# Patient Record
Sex: Male | Born: 1945 | Race: White | Hispanic: No | Marital: Married | Smoking: Current every day smoker
Health system: Southern US, Community
[De-identification: ages and names within clinical notes are randomized; demographics above are authoritative.]

## PROBLEM LIST (undated history)

## (undated) DIAGNOSIS — I2510 Atherosclerotic heart disease of native coronary artery without angina pectoris: Secondary | ICD-10-CM

## (undated) DIAGNOSIS — I059 Rheumatic mitral valve disease, unspecified: Secondary | ICD-10-CM

## (undated) DIAGNOSIS — Z720 Tobacco use: Secondary | ICD-10-CM

## (undated) DIAGNOSIS — I340 Nonrheumatic mitral (valve) insufficiency: Secondary | ICD-10-CM

## (undated) DIAGNOSIS — E785 Hyperlipidemia, unspecified: Secondary | ICD-10-CM

## (undated) DIAGNOSIS — I119 Hypertensive heart disease without heart failure: Secondary | ICD-10-CM

## (undated) HISTORY — DX: Hypertensive heart disease without heart failure: I11.9

## (undated) HISTORY — DX: Atherosclerotic heart disease of native coronary artery without angina pectoris: I25.10

## (undated) HISTORY — DX: Rheumatic mitral valve disease, unspecified: I05.9

## (undated) HISTORY — DX: Nonrheumatic mitral (valve) insufficiency: I34.0

## (undated) HISTORY — PX: HERNIA REPAIR: SHX51

## (undated) HISTORY — DX: Hyperlipidemia, unspecified: E78.5

## (undated) HISTORY — DX: Tobacco use: Z72.0

---
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
## 2006-08-04 HISTORY — PX: OTHER SURGICAL HISTORY: SHX169

## 2007-08-13 ENCOUNTER — Encounter: Admission: RE | Admit: 2007-08-13 | Discharge: 2007-08-13 | Payer: Self-pay | Admitting: Orthopedic Surgery

## 2008-07-11 IMAGING — CR DG CERVICAL SPINE 2 OR 3 VIEWS
3 series · 3 of 3 positions shown · non-contrast
Comparison: none

CLINICAL DATA: Left sided neck and shoulder tightness and discomfort. 
 CERVICAL SPINE - 3 VIEW:

[view not recorded (1 of 3)]
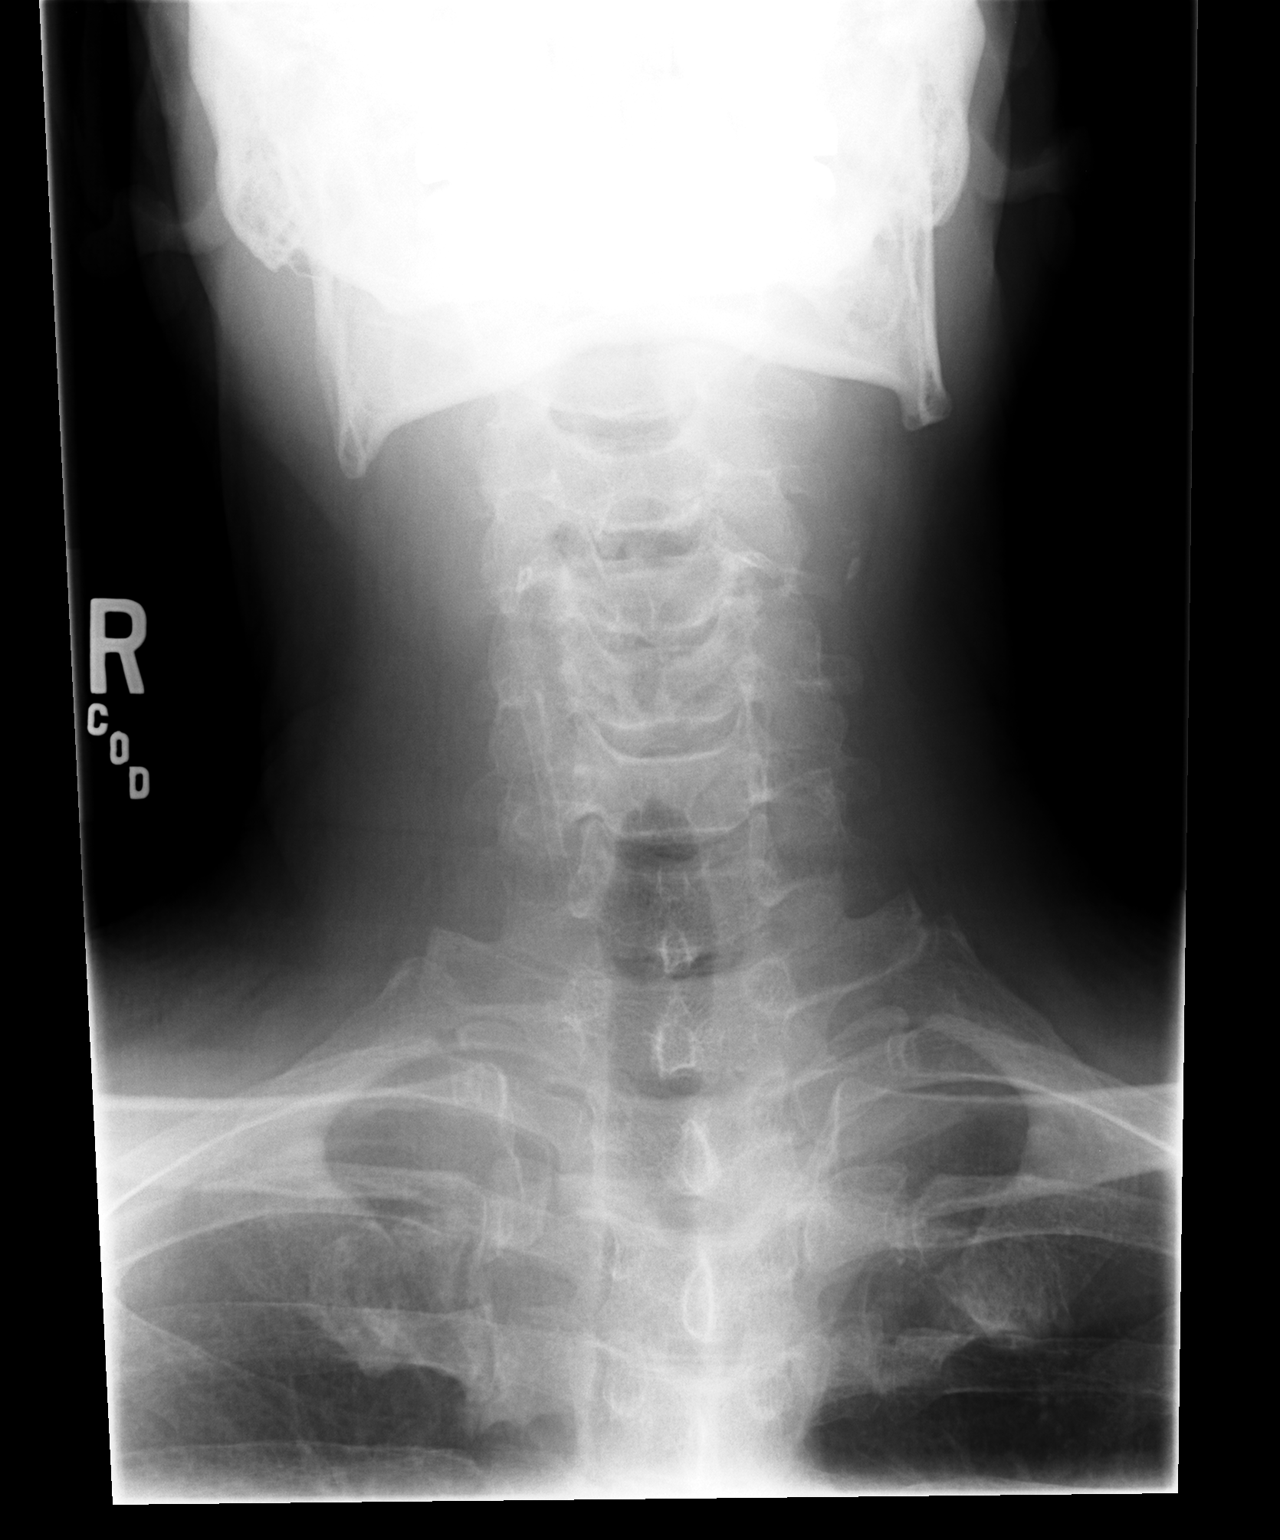

[view not recorded (2 of 3)]
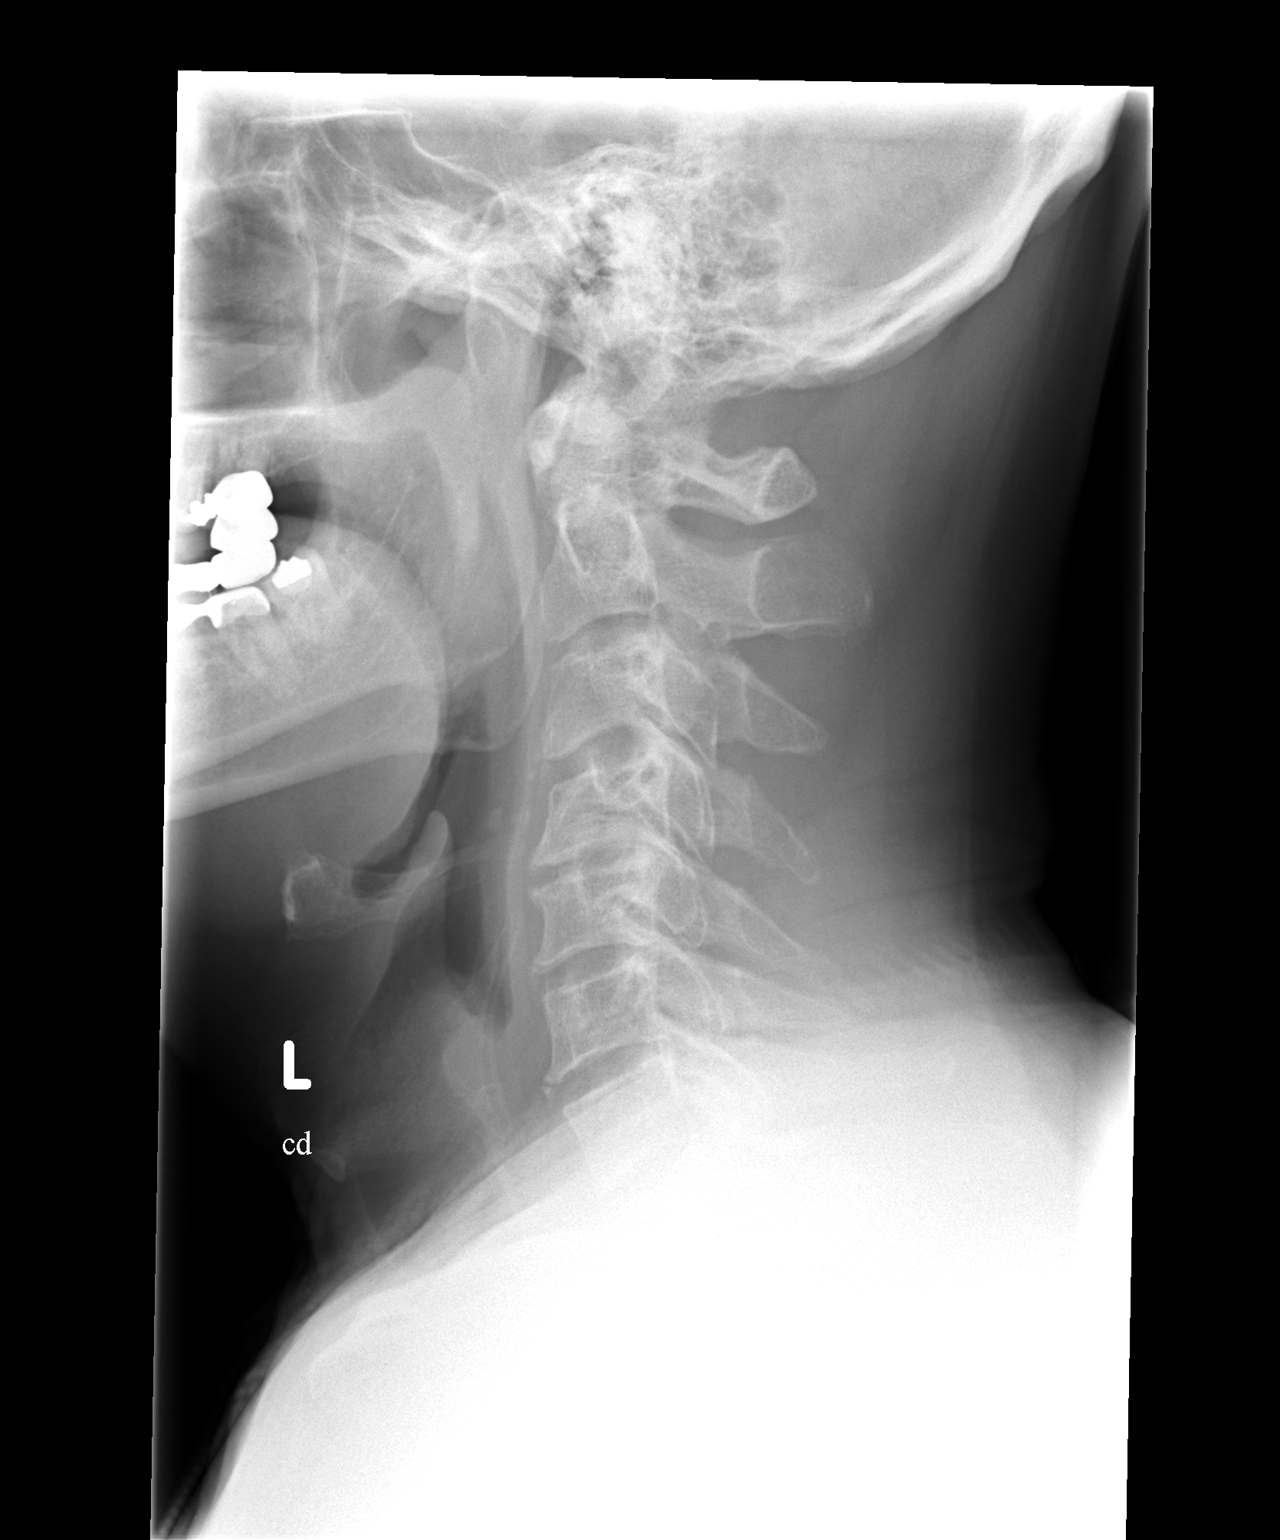

[view not recorded (3 of 3)]
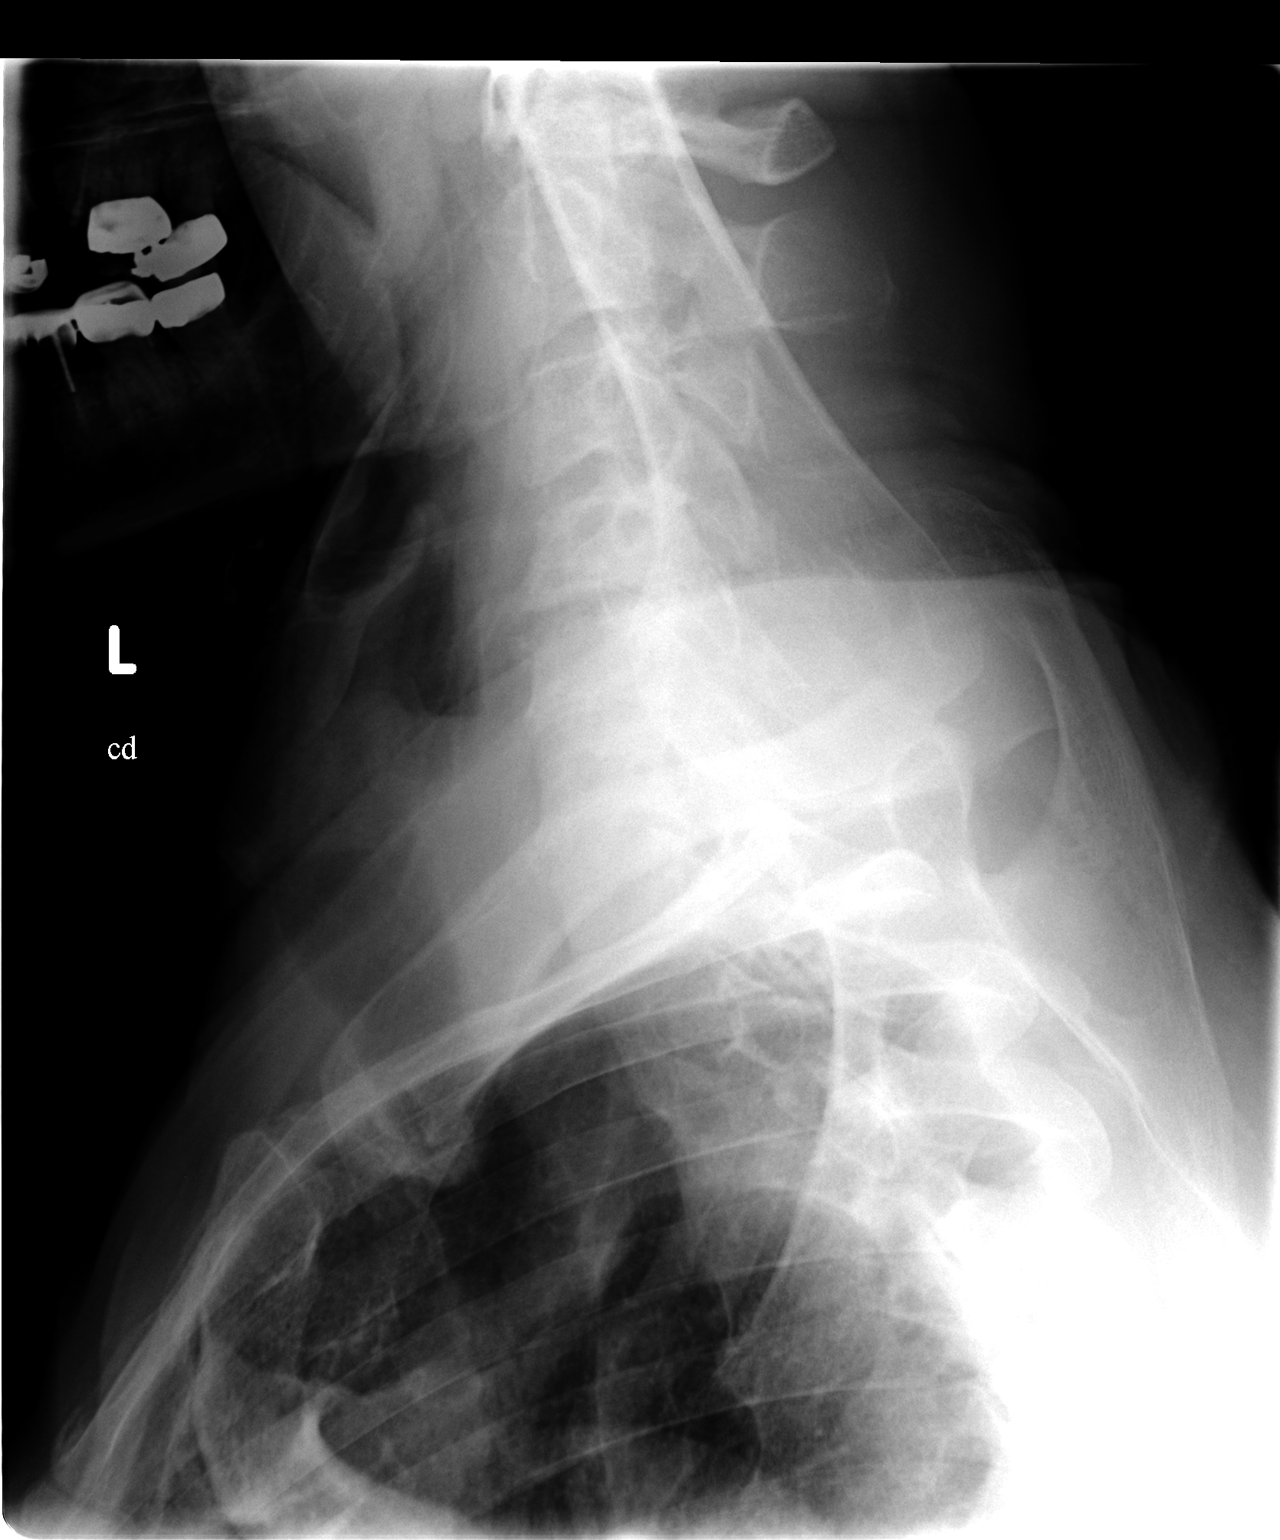

[3 of 3 positions shown; findings below may reference images not displayed]

FINDINGS: Three views of the cervical spine were obtained.  There is degenerative disk disease primarily at C4-5 where there is loss of disk space and mild spur formation.  The remainder of the disk spaces appear normal, and there is normal alignment present.
IMPRESSION: Mild degenerative disk disease at C4-5.

## 2011-04-15 ENCOUNTER — Ambulatory Visit (HOSPITAL_COMMUNITY)
Admission: RE | Admit: 2011-04-15 | Discharge: 2011-04-15 | Disposition: A | Discharge status: Home / Self Care | Payer: 59 | Source: Ambulatory Visit | Attending: Cardiology | Admitting: Cardiology

## 2011-04-15 DIAGNOSIS — Q211 Atrial septal defect: Secondary | ICD-10-CM | POA: Insufficient documentation

## 2011-04-15 DIAGNOSIS — Q2111 Secundum atrial septal defect: Secondary | ICD-10-CM | POA: Insufficient documentation

## 2011-04-15 DIAGNOSIS — I059 Rheumatic mitral valve disease, unspecified: Secondary | ICD-10-CM | POA: Insufficient documentation

## 2011-04-22 ENCOUNTER — Ambulatory Visit (HOSPITAL_COMMUNITY)
Admission: RE | Admit: 2011-04-22 | Discharge: 2011-04-22 | Disposition: A | Discharge status: Home / Self Care | Payer: 59 | Source: Ambulatory Visit | Attending: Cardiology | Admitting: Cardiology

## 2011-04-22 DIAGNOSIS — I2510 Atherosclerotic heart disease of native coronary artery without angina pectoris: Secondary | ICD-10-CM

## 2011-04-22 HISTORY — DX: Atherosclerotic heart disease of native coronary artery without angina pectoris: I25.10

## 2011-04-22 HISTORY — PX: CARDIAC CATHETERIZATION: SHX172

## 2011-04-22 LAB — POCT I-STAT 3, VENOUS BLOOD GAS (G3P V)
Bicarbonate: 28 mEq/L — ABNORMAL HIGH (ref 20.0–24.0)
Bicarbonate: 28.7 mEq/L — ABNORMAL HIGH (ref 20.0–24.0)
O2 Saturation: 67 %
O2 Saturation: 67 %
TCO2: 30 mmol/L (ref 0–100)
pCO2, Ven: 46.7 mmHg (ref 45.0–50.0)
pH, Ven: 7.398 — ABNORMAL HIGH (ref 7.250–7.300)
pO2, Ven: 36 mmHg (ref 30.0–45.0)

## 2011-04-22 LAB — POCT I-STAT 3, ART BLOOD GAS (G3+)
Acid-Base Excess: 2 mmol/L (ref 0.0–2.0)
Bicarbonate: 27.8 mEq/L — ABNORMAL HIGH (ref 20.0–24.0)
O2 Saturation: 91 %
pO2, Arterial: 63 mmHg — ABNORMAL LOW (ref 80.0–100.0)

## 2011-05-05 ENCOUNTER — Encounter: Payer: 59 | Admitting: Thoracic Surgery (Cardiothoracic Vascular Surgery)

## 2011-05-12 NOTE — Cardiovascular Report (Signed)
NAMEKELEN, LAURA NO.:  [id number]  MEDICAL RECORD NO.:  [id number]  LOCATION:  [facility]                         FACILITY:  [facility]  PHYSICIAN:  Pamella Pert, MD DATE OF BIRTH:  1946-07-18  DATE OF PROCEDURE:  04/22/2011 DATE OF DISCHARGE:                           CARDIAC CATHETERIZATION   PROCEDURES PERFORMED: 1. Left ventriculography. 2. Selective right and left coronary arteriography. 3. Thoracic and abdominal aortogram. 4. Right heart catheterization and calculation of cardiac output and     cardiac index by Fick.  OPERATOR:  Pamella Pert, MD  INDICATIONS:  Mr. Demetres Prochnow is a 65 year old gentleman with a history of hypertension, hyperlipidemia, tobacco use, and history of abnormal heart sounds.  He has had outpatient stress testing which had revealed inferior wall scar with inferior ischemia and also a TEE that was recently performed a week ago had revealed moderately severe mitral regurgitation which is eccentric.  He is now brought to the cardiac cath lab to evaluate both the severity of mitral regurgitation, right heart catheterization to evaluate pulmonary hypertension, and to evaluate his coronary anatomy given abnormal stress test.  RIGHT HEART CATHETERIZATION HEMODYNAMIC DATA:  RA pressure 12/8, mean 7 mmHg.  RA saturation 67%.  RV pressure 23/3 with end-diastolic pressure of 6 mmHg.  PA pressure was 20/12 with a mean of 16 mmHg.  PA saturation was 67%.  Pulmonary capillary wedge 10/9 with a mean of 9 mmHg.  Aortic saturation was 91%.  Cardiac output was 6.73 with a cardiac index of 3.02 by Fick.  The pulmonary capillary wedge tracing did not reveal significant giant V- waves.  There was no evidence of pulmonary hypertension.  LEFT HEART CATHETERIZATION HEMODYNAMIC DATA:  Left ventricular pressure was 92/4 with end-diastolic pressure of 11 mmHg.  Aortic pressure was 83/61 with a mean of 72 mmHg.  There is no pressure  gradient across the aortic valve.  ANGIOGRAPHIC DATA:  Left ventricle:  Left ventricular systolic function was preserved with ejection fraction of 50-55% with inferior wall akinesis.  There is moderate-to-severe if not severe mitral regurgitation.  Right coronary artery:  Right coronary artery is a dominant vessel.  It is occluded in the mid segment with bridging collaterals.  There is a long segment occlusion.  It gives origin to small to moderate-sized 2 PDA branches and a moderate-sized PL branch.  Proximal mid right coronary shows mild luminal irregularity.  Left main coronary artery:  Left main coronary artery is a large caliber vessel.  It is smooth and normal.  LAD:  The LAD is large caliber vessel giving origin to large diagonal 1 and a small-sized but large lumen diagonal 2.  The diagonal 2 has a 70% proximal stenosis.  The LAD itself shows mild luminal irregularity in the mid-to-distal segment.  Circumflex:  Circumflex is a very large caliber vessel.  It has got a very mild luminal irregularity.  Thoracic aortogram and abdominal aortogram:  Thoracic aortogram and abdominal aortogram revealed no evidence of thoracic aneurysm or abdominal aortic aneurysm.  The renal arteries are widely patent.  The mesenteric vessels filled very nicely.  Aortoiliac bifurcation was widely patent.  IMPRESSION: 1. Chronically totally occluded  right coronary artery with inferior     wall akinesis. 2. Moderately severe mitral regurgitation if not severe.  No evidence     of pulmonary hypertension.  I was unable to cannulate the left     pulmonary artery as the catheter would not change the position,     hence the reversal of flow may not be evident on the right     pulmonary artery catheter cannulation. 3. No evidence of thoracic or abdominal aortic aneurysm.  No evidence     of renal artery stenosis. 4. Single-vessel coronary artery disease involving the occluded right     coronary  artery which is dominant.  However, the distal bed appears     to be small to moderate-sized at most.  There is also a 70%     diagonal 2 stenosis.  RECOMMENDATIONS:  Based on the coronary and left ventriculogram, I would recommend that he probably needs mitral valve repair given the severity of mitral regurgitation.  The left atrium surprisingly looked much larger than by TEE or by transthoracic.  The mitral regurgitation also appeared to be much more significant by angiography than by TEE or by transthoracic echocardiography.  I will discuss the findings with Dr. Cornelius Moras and see what his recommendations will be.  I will discuss the options with the patient.  PROCEDURE TECHNIQUE:  Under sterile precautions using a 5-French right antecubital vein access, the right heart catheterization was performed using a balloon-tipped 5-French catheter.  Hemodynamics were carefully performed and data was carefully analyzed.  I obtained 6-French right radial arterial access.  A 6-French TIG #4 catheter was advanced into the ascending aorta.  Selective left and right coronary arteriography was performed.  The catheter was then pulled out of body and a 5-French pigtail catheter was advanced into the left ventricle and left ventriculography was performed in the RAO projection.  The same catheter was utilized to perform the descending thoracic and abdominal aortogram.  The catheter was then pulled out of body over a safety J-wire.  Hemostasis was obtained by applying TR band at radial arterial access site and manual pressure at the venous site.  The patient tolerated the procedure.  No immediate complication.     Pamella Pert, MD     JRG/MEDQ  D:  04/22/2011  T:  04/22/2011  Job:  [id number]  cc:   Darrin Nipper, MD Salvatore Decent Cornelius Moras, M.D.  Electronically Signed by Yates Decamp MD on 05/12/2011 08:41:44 AM

## 2011-05-30 ENCOUNTER — Encounter: Payer: Self-pay | Admitting: Thoracic Surgery (Cardiothoracic Vascular Surgery)

## 2011-05-30 DIAGNOSIS — I059 Rheumatic mitral valve disease, unspecified: Secondary | ICD-10-CM | POA: Insufficient documentation

## 2011-05-30 DIAGNOSIS — E785 Hyperlipidemia, unspecified: Secondary | ICD-10-CM | POA: Insufficient documentation

## 2011-05-30 DIAGNOSIS — I119 Hypertensive heart disease without heart failure: Secondary | ICD-10-CM | POA: Insufficient documentation

## 2011-05-30 DIAGNOSIS — Z720 Tobacco use: Secondary | ICD-10-CM | POA: Insufficient documentation

## 2011-05-30 DIAGNOSIS — I2510 Atherosclerotic heart disease of native coronary artery without angina pectoris: Secondary | ICD-10-CM | POA: Insufficient documentation

## 2011-05-30 DIAGNOSIS — M109 Gout, unspecified: Secondary | ICD-10-CM | POA: Insufficient documentation

## 2011-06-09 ENCOUNTER — Encounter: Payer: Self-pay | Admitting: Thoracic Surgery (Cardiothoracic Vascular Surgery)

## 2011-06-09 ENCOUNTER — Other Ambulatory Visit: Payer: Self-pay | Admitting: Thoracic Surgery (Cardiothoracic Vascular Surgery)

## 2011-06-09 ENCOUNTER — Institutional Professional Consult (permissible substitution) (INDEPENDENT_AMBULATORY_CARE_PROVIDER_SITE_OTHER): Payer: 59 | Admitting: Thoracic Surgery (Cardiothoracic Vascular Surgery)

## 2011-06-09 VITALS — BP 131/82 | HR 88 | Resp 20 | Ht 74.0 in | Wt 212.0 lb

## 2011-06-09 DIAGNOSIS — I340 Nonrheumatic mitral (valve) insufficiency: Secondary | ICD-10-CM

## 2011-06-09 DIAGNOSIS — I059 Rheumatic mitral valve disease, unspecified: Secondary | ICD-10-CM

## 2011-06-09 DIAGNOSIS — Z01818 Encounter for other preprocedural examination: Secondary | ICD-10-CM

## 2011-06-09 NOTE — Progress Notes (Signed)
PCP is Charolett Bumpers, MD Referring Provider is Pamella Pert, MD  Chief Complaint  Patient presents with  . 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  He has history of hypertension, hyperlipidemia, gout, long-standing tobacco abuse, and recently discovered mitral regurgitation and coronary artery disease. The patient was seen in urgent care several months ago for a flareup of gout. He was noted to have a heart murmur on physical exam. He was referred for elective cardiology consultation. The patient was unaware of any previous history of heart murmur in the past. An echocardiogram was performed demonstrating moderate to severe mitral regurgitation with normal left ventricular function. Transesophageal echocardiogram was performed 04/15/2011, confirming the presence of mitral valve prolapse and at least moderate to severe mitral regurgitation. The patient subsequently underwent left heart catheterization on 04/22/2011. This confirmed the presence of at least moderate to severe mitral regurgitation and also identified the presence of single-vessel coronary artery disease. Right heart catheterization was not performed. The patient has been referred for elective mitral valve repair.  Past Medical History  Diagnosis Date  . Coronary atherosclerosis   . Mitral valve disorders   . Benign hypertensive heart disease without heart failure   . Hyperlipidemia   . Tobacco abuse   . Gout   . Mitral regurgitation   . Coronary artery disease 04/22/2011    100% chronic occlusion of RCA    Past Surgical History  Procedure Date  . Knee surgery 1960's    x5  . Arthroscopic knee surgery 2008  . Hernia repair     at age 12    History reviewed. No pertinent family history.  Social  History History  Substance Use Topics  . Smoking status: Current Everyday Smoker -- 1.5 packs/day    Types: Cigarettes  . Smokeless tobacco: Not on file  . Alcohol Use: 1.5 - 2.0 oz/week    3-4 drink(s) per week    Current Outpatient Prescriptions  Medication Sig Dispense Refill  . aspirin 325 MG tablet Take 325 mg by mouth daily.        . metoprolol succinate (TOPROL-XL) 25 MG 24 hr tablet Take 25 mg by mouth daily.        . Olmesartan-Amlodipine-HCTZ (TRIBENZOR) 40-10-25 MG TABS Take by mouth.        . rosuvastatin (CRESTOR) 20 MG tablet Take 20 mg by mouth daily.          Allergies  Allergen Reactions  . Penicillin V Potassium     Review of Systems: The patient reports feeling well overall. He is normal energy level. Over the last 4 years he has gained some weight, perhaps as much of 40 pounds. She attributes this to decrease physical activity as he no longer exercises regularly. The patient specifically denies any exertional shortness of breath. He denies resting shortness of breath, PND, orthopnea, lower extremity edema. The patient also denies exertional chest discomfort. He reports occasional palpitations. He denies productive cough. He has had a few episodes of bronchitis in the past, most recently 3 or 4 months ago. He continues to smoke regularly. He has no difficulty swallowing. He denies ongoing symptoms of reflux. He reports normal bowel function. He denies hematochezia, hematemesis, melena. He denies problems with significant musculoskeletal issues such as arthritis or arthralgias.  He does have some chronic knee pain that is mild. He also has some chronic pain in both feet. He has no difficulty urinating. He denies urinary urgency or frequency. He denies symptoms suggestive of claudication. He denies any history of transient numbness or weakness on either upper or lower extremity. He denies any transient visual disturbances. He has not seen a dentist in a long time and he knows  that he needs some dental work performed. He has not had fevers or chills recently. The remainder of his review of systems is unremarkable.  BP 131/82  Pulse 88  Resp 20  Ht 6\' 2"  (1.88 m)  Wt 212 lb (96.163 kg)  BMI 27.22 kg/m2  SpO2 96% Physical Exam: On physical exam the patient is a well-appearing male who appears of stated age in no acute distress. HEENT exam is unrevealing. The neck is supple. There is no palpable lymphadenopathy. There no carotid bruits. Auscultation of the chest demonstrates clear breath sounds which are symmetrical bilaterally.  Cardiovascular exam is notable for regular rate and rhythm. There is a grade III/VI holosystolic murmur which is heard best along the left lower sternal border. No diastolic murmurs are noted. The abdomen is soft nondistended and nontender. Bowel sounds are present. The extremities are warm and well-perfused. There is no lower extremity edema. Distal pulses are palpable but slightly diminished in the posterior tibial position, more so on the left than the right. There is no sign of lower extremity edema. There is no significant varicose veins. Neurologic examination is grossly nonfocal and symmetrical throughout. The skin is clean dry and healthy appearing throughout. Rectal and GU exams are both deferred.  Diagnostic Tests: Transesophageal echocardiogram performed 04/15/2011 is reviewed. This demonstrates at least moderate to severe mitral regurgitation. The functional anatomy appeared to be consistent with type II dysfunction with what appears to be a small flail segment of either the anterior leaflet or a commissural leaflet. The jet of regurgitation is eccentric and courses posteriorly around the left atrium. There may be some restriction of the posterior leaflet, but overall leaflet motion appears otherwise normal. It is possible that there could be an old vegetation, but overall I suspect this is simple fibroelastic deficiency type degenerative  disease. Left ventricular function appears normal. The aortic valve appears normal. No other significant abnormalities are noted.  Left heart catheterization performed 04/22/2011 is reviewed. This confirmed the presence of at least moderate mitral regurgitation. There is normal left ventricular systolic function. There single-vessel coronary artery disease with chronic occlusion of the right coronary artery. There appears to be codominant coronary circulation in the right coronary artery is relatively small. The terminal branches of the distal right coronary artery do fill via right to right collaterals. The terminal branches are relatively small. There is moderate disease in the left anterior descending coronary artery although there are no significant flow limiting lesions. The left circumflex coronary system is free of significant disease as well. The descending thoracic abdominal aorta all appear normal and free of significant aortoiliac disease.  Impression: The patient remains completely asymptomatic and presents with newly discovered mitral regurgitation. Transesophageal echocardiogram reveals findings consistent with mitral valve prolapse and what might be a small flail leaflet. Based upon the transesophageal echocardiogram, I feel there would be a high likelihood that the patient's valve should be repairable. The patient also has single-vessel coronary artery disease with chronic occlusion of the right coronary artery. The terminal branches of the right coronary artery a relatively small and filled  with collaterals from the right coronary system. Left ventricular function is normal. Options include continued close followup with medical therapy versus elective mitral valve repair with or without concomitant coronary artery bypass grafting to the terminal branches of the right coronary artery.  Plan: I discussed matters at length with the patient here in the office today. Options were discussed in  detail and the rationale for early surgical intervention in the treatment of mitral regurgitation has been explained at length. The patient desires to think matters over, but overall he feels that he wishes to proceed with elective mitral valve repair during this calendar year. He is concerned about undergoing a sternotomy, and he seems inclined to wish to have his mitral valve repair using minimally invasive techniques. He understands that this would come with a small risk of acute myocardial infarction because of the chronically occluded right coronary artery. He has been counseled regarding the need to quit smoking. He has not seen a dentist in many years. We will send him for elective dental consultation while he thinks over options. He desires to return next week to further discuss matters and potentially schedule elective surgery in the near future. All of his questions been addressed.

## 2011-06-12 ENCOUNTER — Ambulatory Visit (HOSPITAL_COMMUNITY)
Admission: RE | Admit: 2011-06-12 | Discharge: 2011-06-12 | Disposition: A | Discharge status: Home / Self Care | Payer: 59 | Source: Ambulatory Visit | Attending: Thoracic Surgery (Cardiothoracic Vascular Surgery) | Admitting: Thoracic Surgery (Cardiothoracic Vascular Surgery)

## 2011-06-12 ENCOUNTER — Other Ambulatory Visit (HOSPITAL_COMMUNITY): Payer: Self-pay | Admitting: Radiology

## 2011-06-12 DIAGNOSIS — Z01811 Encounter for preprocedural respiratory examination: Secondary | ICD-10-CM | POA: Insufficient documentation

## 2011-06-12 DIAGNOSIS — I059 Rheumatic mitral valve disease, unspecified: Secondary | ICD-10-CM | POA: Insufficient documentation

## 2011-06-12 DIAGNOSIS — I340 Nonrheumatic mitral (valve) insufficiency: Secondary | ICD-10-CM

## 2011-06-12 DIAGNOSIS — Z01818 Encounter for other preprocedural examination: Secondary | ICD-10-CM

## 2011-06-12 MED ORDER — ALBUTEROL SULFATE (5 MG/ML) 0.5% IN NEBU
2.5000 mg | INHALATION_SOLUTION | Freq: Once | RESPIRATORY_TRACT | Status: AC
  Administered 2011-06-12: 2.5 mg via RESPIRATORY_TRACT

## 2011-06-16 ENCOUNTER — Encounter: Payer: Self-pay | Admitting: Thoracic Surgery (Cardiothoracic Vascular Surgery)

## 2011-06-16 ENCOUNTER — Ambulatory Visit (INDEPENDENT_AMBULATORY_CARE_PROVIDER_SITE_OTHER): Payer: 59 | Admitting: Thoracic Surgery (Cardiothoracic Vascular Surgery)

## 2011-06-16 VITALS — BP 126/86 | HR 88 | Resp 16 | Ht 74.0 in | Wt 212.0 lb

## 2011-06-16 DIAGNOSIS — I059 Rheumatic mitral valve disease, unspecified: Secondary | ICD-10-CM

## 2011-06-16 NOTE — Progress Notes (Signed)
PCP is Charolett Bumpers, MD Referring Provider is Pamella Pert, MD  Chief Complaint  Patient presents with  . Mitral Regurgitation    1 week  to discuss further...had PFT'S...sees Dr. Kristin Bruins this Thursday morning in the dental clinic    HPI: Patient returns for further followup to discuss possible elective mitral valve repair. He was originally seen in consultation last week and a full consultation report was dictated at that time. Since then he underwent pulmonary function testing although the report from that test is not yet available. He continues to smoke.  Past Medical History  Diagnosis Date  . Coronary atherosclerosis   . Mitral valve disorders   . Benign hypertensive heart disease without heart failure   . Hyperlipidemia   . Tobacco abuse   . Gout   . Mitral regurgitation   . Coronary artery disease 04/22/2011    100% chronic occlusion of RCA    Past Surgical History  Procedure Date  . Knee surgery 1960's    x5  . Arthroscopic knee surgery 2008  . Hernia repair     at age 24    No family history on file.  Social History History  Substance Use Topics  . Smoking status: Current Everyday Smoker -- 1.5 packs/day    Types: Cigarettes  . Smokeless tobacco: Not on file  . Alcohol Use: 1.5 - 2.0 oz/week    3-4 drink(s) per week    Current Outpatient Prescriptions  Medication Sig Dispense Refill  . aspirin 325 MG tablet Take 325 mg by mouth daily.        . metoprolol succinate (TOPROL-XL) 25 MG 24 hr tablet Take 25 mg by mouth daily.        . Olmesartan-Amlodipine-HCTZ (TRIBENZOR) 40-10-25 MG TABS Take by mouth.        . rosuvastatin (CRESTOR) 20 MG tablet Take 20 mg by mouth daily.          Allergies  Allergen Reactions  . Penicillin V Potassium     Review of Systems: Unchanged from previously  BP 126/86  Pulse 88  Resp 16  Ht 6\' 2"  (1.88 m)  Wt 212 lb (96.163 kg)  BMI 27.22 kg/m2  SpO2 98% Physical Exam: Unchanged from previous  exam  Diagnostic Tests: No new reports to review. Pulmonary function tests at been completed but the report is not available yet.  Impression: Mitral valve prolapse with severe mitral regurgitation and single-vessel coronary artery disease.  Plan: We again reviewed options at length here in the office today. We reviewed images from his cardiac catheterization and discussed the relative risks and benefits of concomitant coronary artery bypass grafting do to the chronic occlusion of the right coronary artery. The patient has a relatively small right coronary artery with codominant coronary circulation. The patient does not wish to have surgery through a conventional sternotomy and prefers the minimally invasive approach. Because the vascular territory of infected by the right coronary artery is relatively small, the associated risks of proceeding with mitral valve repair without concomitant coronary artery bypass grafting are probably relatively low although not 0. We discussed this for more than 30 minutes and all of his questions been addressed. He is scheduled to have a dental consultation later this week. He wants to schedule elective mitral valve repair on Tuesday, December 11. We will see him back here in the office in 2 weeks to review his pulmonary function test results and to make final plans for surgery. We will start  him on amiodarone at that time prophylactically in anticipation of his surgery. He states that he plans to stop smoking now although he may continue to use nicotine gum during the interim period of time. All of his questions been addressed.

## 2011-06-16 NOTE — Patient Instructions (Signed)
Stop smoking ASAP

## 2011-06-18 ENCOUNTER — Encounter (HOSPITAL_COMMUNITY): Payer: Self-pay | Admitting: Dentistry

## 2011-06-18 ENCOUNTER — Ambulatory Visit (HOSPITAL_COMMUNITY): Payer: Self-pay | Admitting: Dentistry

## 2011-06-18 VITALS — BP 129/69 | HR 94 | Temp 97.4°F

## 2011-06-18 DIAGNOSIS — K036 Deposits [accretions] on teeth: Secondary | ICD-10-CM

## 2011-06-18 DIAGNOSIS — K045 Chronic apical periodontitis: Secondary | ICD-10-CM

## 2011-06-18 DIAGNOSIS — K089 Disorder of teeth and supporting structures, unspecified: Secondary | ICD-10-CM

## 2011-06-18 DIAGNOSIS — M278 Other specified diseases of jaws: Secondary | ICD-10-CM

## 2011-06-18 DIAGNOSIS — K083 Retained dental root: Secondary | ICD-10-CM

## 2011-06-18 DIAGNOSIS — K0530 Chronic periodontitis, unspecified: Secondary | ICD-10-CM | POA: Insufficient documentation

## 2011-06-18 DIAGNOSIS — K08409 Partial loss of teeth, unspecified cause, unspecified class: Secondary | ICD-10-CM

## 2011-06-18 DIAGNOSIS — K0889 Other specified disorders of teeth and supporting structures: Secondary | ICD-10-CM

## 2011-06-18 NOTE — Progress Notes (Signed)
DENTAL CONSULTATION       June 18, 2011  Patient:   James Briggs Date of birth:  [birth year] MRN:   [id number]  VITALS: BP 129/69  Pulse 94  Temp 97.4 F (36.3 C)  Chief Complaint: Patient presents for pre-heart valve surgery dental protocol evaluation  HPI: James Briggs is a 65 year old male recently diagnosed with moderate to severe mitral regurgitation. Patient with anticipated mitral valve replacement repair with Dr. Cornelius Moras.  Patient now seen as part of a pre-heart valve surgery dental protocol evaluation to rule out dental infection that may affect the patient's systemic health anticipated heart valve surgery.  PMH: Past Medical History  Diagnosis Date  . Mitral valve disorders   . Benign hypertensive heart disease without heart failure   . Hyperlipidemia   . Tobacco abuse   . Gout   . Mitral regurgitation   . Coronary atherosclerosis   . Coronary artery disease 04/22/2011    100% chronic occlusion of RCA    ALLERGIES: Allergies  Allergen Reactions  . Penicillin V Potassium Shortness Of Breath and Rash    MEDICATIONS: Current Outpatient Prescriptions  Medication Sig Dispense Refill  . aspirin 325 MG tablet Take 325 mg by mouth daily.        . metoprolol succinate (TOPROL-XL) 25 MG 24 hr tablet Take 25 mg by mouth daily.        . Olmesartan-Amlodipine-HCTZ (TRIBENZOR) 40-10-25 MG TABS Take by mouth.        . rosuvastatin (CRESTOR) 20 MG tablet Take 20 mg by mouth daily.        . colchicine 0.6 MG tablet Take 0.6 mg by mouth. Take one tablet as needed for acute gout attack         SOCIAL HISTORY: History   Social History  . Marital Status: Married    Spouse Name: N/A    Number of Children: N/A  . Years of Education: N/A   Occupational History  . Not on file.   Social History Main Topics  . Smoking status: Current Everyday Smoker -- 1.5 packs/day    Types: Cigarettes  . Smokeless tobacco: Not on file  . Alcohol Use: 1.5 - 2.0 oz/week    3-4  drink(s) per week  . Drug Use: No  . Sexually Active: Not on file   Other Topics Concern  . Not on file   Social History Narrative  . No narrative on file    Patient with a history of smoking one to one half packs per day for 30 years by patient report. Patient has never used smokeless tobacco. Patient indicates that he drinks approximately 3-4 glasses of wine per week.  FAMILY HISTORY: Family History  Problem Relation Age of Onset  . Hypertension Mother    Mother died at age 84 from" old age". Father died from suicide.  FUNCTIONAL ASSESSMENT: Patient remains independent for ADLs .  REVIEW OF SYSTEMS: Reviewed from chart for this admission.  DENTAL HISTORY: CC: Patient needs pre-heart of surgery dental protocol evaluation  HPI: Patient recently diagnosed with mitral valve disease. Patient with anticipated mitral valve surgery. Patient is now seen as part of a pre-mitral valve surgery dental protocol to rule out dental infection that may affect the patient's systemic health and anticipated heart valve surgery.  DENTAL EXAMINATION:  GENERAL: Patient is a well-developed, well-nourished male in no acute distress. HEAD AND NECK: There is no palpable lymphadenopathy. The patient denies acute TMJ symptoms. INTRAORAL EXAM: Patient has normal  saliva. There is no evidence of abscess formation within the mouth. DENTITION: Patient is missing tooth numbers 1, 13, 14, 16, 17, and 30. There are retained roots in the area of tooth numbers 3, 5, 21, and 28. PERIODONTAL: Patient has chronic periodontitis with plaque and calculus accumulations, gingival recession, and tooth mobility. There is moderate to severe bone loss. DENTAL CARIES/SUBOPTIMAL RESTORATIONS: There are multiple dental caries and suboptimal dental restorations as per dental charting form. ENDODONTIC: Patient denies acute pulpitis symptoms. The patient appears to have some incipient periapical pathology associated with tooth #25 and  possibly #29. Patient has previous root canal therapies on tooth numbers 3, 5, and 20. The root canal therapy is associated with tooth numbers 3 and 5 are now exposed to the oral environment. CROWN AND BRIDGE:The patient has multiple crown and bridge restorations. Some of these restorations are less than ideal. Tooth #12 has fractured porcelain. The bridge from tooth numbers 28 through 30 appears to have increased periodontal ligament space associated with both abutment teeth with possible incipient periapical pathology. PROSTHODONTIC:There are no partial dentures. OCCLUSION:Patient has a poor occlusal scheme secondary to multiple missing teeth, multiple retained root segments, deep overbite, and lack of replacement of the missing teeth with dental prostheses. RADIOGRAPHIC INTERPRETATION: There are multiple missing teeth. There are multiple retained root segments. There are dental caries noted. There is moderate to severe bone loss noted. There multiple dental restorations noted. There are root canal therapies associated with tooth numbers 3, 5, and 20. The root canals of tooth numbers 3 and 5 are now exposed to the oral environment. There is an increased periodontal ligament space associated with abutment teeth numbers 29 and 31.  ASSESSMENTS: 1. Chronic periodontitis with bone loss 2. Plaque and calculus accumulation  3. Gingival recession 4. Tooth mobility 5. Dental caries 6. Bilateral mandibular lingual tori 7. Multiple retained root segments 8. Previous root canal therapies now exposed to the oral environment. 9. Multiple areas of periapical pathology or radiolucency. 10. Increased periodontal ligament space associated with the abutment teeth of tooth numbers 29 and 31. 11. Multiple suboptimal dental restorations. 12. Deep overbite 13. No history of partial dentures 14. Poor occlusal scheme.  PLAN/RECOMMENDATIONS: 1. I discussed the risks, benefits, and complications of various treatment  options with the patient in relationship to his medical and dental conditions and anticipated heart valve surgery. We discussed various treatment options to include no treatment, total and subtotal extractions with alveoloplasty, pre-prosthetic surgery as indicated, periodontal therapy, dental restorations, root canal therapy, crown and bridge therapy, implant therapy, and replacement of the missing teeth as indicated after adequate healing. We discussed possible referrals to another general dentist, an oral surgeon, an endodontist, and a periodontal specialist. Patient currently wishes to think about his options. Patient will then contact dental medicine with his decision in the next several days. Patient may be interested in deferring heart valve surgery until he can  get his dental problems addressed completely per his report. 2. We'll discuss findings with cardiology and thoracic surgery team members as indicated to coordinate future heart valve surgery as indicated.  3. Will refer to dental specialists as indicated as directed by the patient.  Dr. Cindra Eves

## 2011-06-20 ENCOUNTER — Other Ambulatory Visit: Payer: Self-pay | Admitting: Thoracic Surgery (Cardiothoracic Vascular Surgery)

## 2011-06-20 ENCOUNTER — Other Ambulatory Visit: Payer: Self-pay

## 2011-06-20 DIAGNOSIS — I059 Rheumatic mitral valve disease, unspecified: Secondary | ICD-10-CM

## 2011-06-30 ENCOUNTER — Ambulatory Visit: Payer: 59 | Admitting: Thoracic Surgery (Cardiothoracic Vascular Surgery)

## 2011-06-30 NOTE — Progress Notes (Signed)
Patient ID: James Briggs, male   DOB: 1945/12/03, 65 y.o.   MRN: [id number] James Briggs has requested to delay his mitral valve repair surgery until after he is able to complete his required dental work.  He has been given a return appointment to see Dr. Cornelius Moras in late January to re-evaluate his dental status and further discuss the timing of his surgery.

## 2011-07-11 ENCOUNTER — Encounter (HOSPITAL_COMMUNITY): Payer: Self-pay

## 2011-07-11 ENCOUNTER — Other Ambulatory Visit (HOSPITAL_COMMUNITY): Payer: Self-pay

## 2011-07-11 ENCOUNTER — Ambulatory Visit (HOSPITAL_COMMUNITY): Payer: 59

## 2011-07-15 ENCOUNTER — Inpatient Hospital Stay: Admit: 2011-07-15 | Payer: Self-pay | Admitting: Thoracic Surgery (Cardiothoracic Vascular Surgery)

## 2011-07-15 SURGERY — REPAIR, MITRAL VALVE, MINIMALLY INVASIVE
Anesthesia: General | Site: Chest | Laterality: Right

## 2011-09-01 ENCOUNTER — Ambulatory Visit: Payer: 59 | Admitting: Thoracic Surgery (Cardiothoracic Vascular Surgery)

## 2011-09-15 ENCOUNTER — Ambulatory Visit: Payer: 59 | Admitting: Thoracic Surgery (Cardiothoracic Vascular Surgery)

## 2012-03-24 ENCOUNTER — Institutional Professional Consult (permissible substitution): Payer: Self-pay | Admitting: Internal Medicine

## 2014-07-24 ENCOUNTER — Ambulatory Visit (HOSPITAL_BASED_OUTPATIENT_CLINIC_OR_DEPARTMENT_OTHER): Admission: RE | Admit: 2014-07-24 | Payer: 59 | Source: Ambulatory Visit | Admitting: Podiatry

## 2014-07-24 ENCOUNTER — Encounter (HOSPITAL_BASED_OUTPATIENT_CLINIC_OR_DEPARTMENT_OTHER): Admission: RE | Payer: Self-pay | Source: Ambulatory Visit

## 2014-07-24 SURGERY — ARTHROPLASTY, TOE
Anesthesia: General | Site: Toe | Laterality: Right

## 2017-08-07 ENCOUNTER — Encounter: Payer: Self-pay | Admitting: Pharmacist Clinician (PhC)/ Clinical Pharmacy Specialist

## 2017-08-07 NOTE — Progress Notes (Signed)
This encounter was created in error - please disregard.

## 2020-09-20 ENCOUNTER — Encounter: Payer: Self-pay | Admitting: Cardiology
